# Patient Record
Sex: Male | Born: 1980 | Race: White | Hispanic: No | Smoking: Current every day smoker
Health system: Southern US, Community
[De-identification: ages and names within clinical notes are randomized; demographics above are authoritative.]

## PROBLEM LIST (undated history)

## (undated) DIAGNOSIS — K5792 Diverticulitis of intestine, part unspecified, without perforation or abscess without bleeding: Secondary | ICD-10-CM

## (undated) HISTORY — PX: IRRIGATION AND DEBRIDEMENT KNEE: SHX5185

## (undated) HISTORY — PX: MANDIBLE FRACTURE SURGERY: SHX706

---

## 2007-07-19 ENCOUNTER — Emergency Department (HOSPITAL_COMMUNITY): Admission: EM | Admit: 2007-07-19 | Discharge: 2007-07-19 | Payer: Self-pay | Admitting: Emergency Medicine

## 2007-09-17 ENCOUNTER — Emergency Department (HOSPITAL_COMMUNITY): Admission: EM | Admit: 2007-09-17 | Discharge: 2007-09-17 | Payer: Self-pay | Admitting: Emergency Medicine

## 2008-03-25 ENCOUNTER — Emergency Department (HOSPITAL_COMMUNITY): Admission: EM | Admit: 2008-03-25 | Discharge: 2008-03-25 | Payer: Self-pay | Admitting: Emergency Medicine

## 2008-12-17 ENCOUNTER — Encounter (INDEPENDENT_AMBULATORY_CARE_PROVIDER_SITE_OTHER): Payer: Self-pay | Admitting: Otolaryngology

## 2008-12-17 ENCOUNTER — Observation Stay (HOSPITAL_COMMUNITY): Admission: EM | Admit: 2008-12-17 | Discharge: 2008-12-18 | Payer: Self-pay | Admitting: Emergency Medicine

## 2009-01-25 ENCOUNTER — Ambulatory Visit (HOSPITAL_BASED_OUTPATIENT_CLINIC_OR_DEPARTMENT_OTHER): Admission: RE | Admit: 2009-01-25 | Discharge: 2009-01-25 | Payer: Self-pay | Admitting: Otolaryngology

## 2009-02-24 ENCOUNTER — Emergency Department (HOSPITAL_COMMUNITY): Admission: EM | Admit: 2009-02-24 | Discharge: 2009-02-24 | Payer: Self-pay | Admitting: Emergency Medicine
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
## 2010-10-22 LAB — POCT HEMOGLOBIN-HEMACUE: Hemoglobin: 15.8 g/dL (ref 13.0–17.0)

## 2010-10-23 LAB — POCT I-STAT, CHEM 8
BUN: 6 mg/dL (ref 6–23)
Calcium, Ion: 1.02 mmol/L — ABNORMAL LOW (ref 1.12–1.32)
Creatinine, Ser: 1.1 mg/dL (ref 0.4–1.5)
Glucose, Bld: 105 mg/dL — ABNORMAL HIGH (ref 70–99)
TCO2: 21 mmol/L (ref 0–100)

## 2010-10-23 LAB — RAPID URINE DRUG SCREEN, HOSP PERFORMED
Amphetamines: NOT DETECTED
Barbiturates: NOT DETECTED
Benzodiazepines: NOT DETECTED
Cocaine: NOT DETECTED

## 2010-10-23 LAB — URINALYSIS, ROUTINE W REFLEX MICROSCOPIC
Bilirubin Urine: NEGATIVE
Glucose, UA: NEGATIVE mg/dL
Ketones, ur: NEGATIVE mg/dL
Protein, ur: NEGATIVE mg/dL
pH: 6 (ref 5.0–8.0)

## 2010-10-23 LAB — URINE MICROSCOPIC-ADD ON

## 2010-10-23 LAB — PROTIME-INR: Prothrombin Time: 14.1 seconds (ref 11.6–15.2)

## 2010-10-23 LAB — CBC
MCHC: 34.3 g/dL (ref 30.0–36.0)
MCV: 94.6 fL (ref 78.0–100.0)
Platelets: 221 10*3/uL (ref 150–400)
RDW: 12.6 % (ref 11.5–15.5)

## 2010-10-23 LAB — LACTIC ACID, PLASMA: Lactic Acid, Venous: 2.7 mmol/L — ABNORMAL HIGH (ref 0.5–2.2)
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
## 2013-03-12 ENCOUNTER — Encounter (HOSPITAL_COMMUNITY): Payer: Self-pay | Admitting: *Deleted

## 2013-03-12 ENCOUNTER — Emergency Department (HOSPITAL_COMMUNITY): Payer: Self-pay

## 2013-03-12 ENCOUNTER — Emergency Department (HOSPITAL_COMMUNITY)
Admission: EM | Admit: 2013-03-12 | Discharge: 2013-03-12 | Disposition: A | Discharge status: Home / Self Care | Payer: Self-pay | Attending: Emergency Medicine | Admitting: Emergency Medicine

## 2013-03-12 DIAGNOSIS — R61 Generalized hyperhidrosis: Secondary | ICD-10-CM | POA: Insufficient documentation

## 2013-03-12 DIAGNOSIS — R630 Anorexia: Secondary | ICD-10-CM | POA: Insufficient documentation

## 2013-03-12 DIAGNOSIS — K5712 Diverticulitis of small intestine without perforation or abscess without bleeding: Secondary | ICD-10-CM | POA: Insufficient documentation

## 2013-03-12 DIAGNOSIS — R6883 Chills (without fever): Secondary | ICD-10-CM | POA: Insufficient documentation

## 2013-03-12 DIAGNOSIS — R1031 Right lower quadrant pain: Secondary | ICD-10-CM | POA: Insufficient documentation

## 2013-03-12 DIAGNOSIS — F17200 Nicotine dependence, unspecified, uncomplicated: Secondary | ICD-10-CM | POA: Insufficient documentation

## 2013-03-12 DIAGNOSIS — Z79899 Other long term (current) drug therapy: Secondary | ICD-10-CM | POA: Insufficient documentation

## 2013-03-12 LAB — COMPREHENSIVE METABOLIC PANEL
ALT: 16 U/L (ref 0–53)
BUN: 13 mg/dL (ref 6–23)
Calcium: 9.8 mg/dL (ref 8.4–10.5)
GFR calc Af Amer: >90 mL/min (ref >90)
Glucose, Bld: 106 mg/dL — ABNORMAL HIGH (ref 70–99)
Sodium: 138 mEq/L (ref 135–145)
Total Protein: 7.4 g/dL (ref 6.0–8.3)

## 2013-03-12 LAB — CBC WITH DIFFERENTIAL/PLATELET
Basophils Relative: 0 % (ref 0–1)
Eosinophils Absolute: 0.1 10*3/uL (ref 0.0–0.7)
Eosinophils Relative: 1 % (ref 0–5)
Lymphs Abs: 2.1 10*3/uL (ref 0.7–4.0)
MCH: 31.8 pg (ref 26.0–34.0)
MCHC: 35.8 g/dL (ref 30.0–36.0)
MCV: 88.7 fL (ref 78.0–100.0)
Platelets: 234 10*3/uL (ref 150–400)
RBC: 5.29 MIL/uL (ref 4.22–5.81)
RDW: 12.3 % (ref 11.5–15.5)

## 2013-03-12 LAB — URINALYSIS, ROUTINE W REFLEX MICROSCOPIC
Nitrite: NEGATIVE
Specific Gravity, Urine: 1.024 (ref 1.005–1.030)
Urobilinogen, UA: 0.2 mg/dL (ref 0.0–1.0)

## 2013-03-12 LAB — URINE MICROSCOPIC-ADD ON

## 2013-03-12 LAB — LIPASE, BLOOD: Lipase: 30 U/L (ref 11–59)

## 2013-03-12 MED ORDER — SODIUM CHLORIDE 0.9 % IV BOLUS (SEPSIS)
1000.0000 mL | Freq: Once | INTRAVENOUS | Status: AC
  Administered 2013-03-12: 1000 mL via INTRAVENOUS

## 2013-03-12 MED ORDER — ONDANSETRON HCL 4 MG/2ML IJ SOLN
4.0000 mg | Freq: Once | INTRAMUSCULAR | Status: AC
  Administered 2013-03-12: 4 mg via INTRAVENOUS
  Filled 2013-03-12: qty 2

## 2013-03-12 MED ORDER — METRONIDAZOLE 500 MG PO TABS: 500.0000 mg | ORAL_TABLET | Freq: Two times a day (BID) | ORAL | Status: DC

## 2013-03-12 MED ORDER — IOHEXOL 300 MG/ML  SOLN
100.0000 mL | Freq: Once | INTRAMUSCULAR | Status: AC | PRN
  Administered 2013-03-12: 100 mL via INTRAVENOUS

## 2013-03-12 MED ORDER — HYDROCODONE-ACETAMINOPHEN 5-325 MG PO TABS: 1.0000 | ORAL_TABLET | ORAL | Status: DC | PRN

## 2013-03-12 MED ORDER — METRONIDAZOLE 500 MG PO TABS
500.0000 mg | ORAL_TABLET | Freq: Once | ORAL | Status: AC
  Administered 2013-03-12: 500 mg via ORAL
  Filled 2013-03-12: qty 1

## 2013-03-12 MED ORDER — PROMETHAZINE HCL 25 MG PO TABS: 25.0000 mg | ORAL_TABLET | Freq: Four times a day (QID) | ORAL | Status: DC | PRN

## 2013-03-12 MED ORDER — IOHEXOL 300 MG/ML  SOLN
20.0000 mL | INTRAMUSCULAR | Status: AC
  Administered 2013-03-12: 25 mL via ORAL

## 2013-03-12 MED ORDER — CIPROFLOXACIN HCL 500 MG PO TABS: 500.0000 mg | ORAL_TABLET | Freq: Two times a day (BID) | ORAL | Status: DC

## 2013-03-12 MED ORDER — FENTANYL CITRATE 0.05 MG/ML IJ SOLN
50.0000 ug | INTRAMUSCULAR | Status: DC | PRN
  Administered 2013-03-12: 50 ug via INTRAVENOUS
  Filled 2013-03-12: qty 2

## 2013-03-12 MED ORDER — CIPROFLOXACIN HCL 500 MG PO TABS
500.0000 mg | ORAL_TABLET | Freq: Once | ORAL | Status: AC
  Administered 2013-03-12: 500 mg via ORAL
  Filled 2013-03-12: qty 1
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
## 2013-03-12 NOTE — ED Notes (Signed)
Reports generalized abd pain x 2 days, denies any n/v/d. No distress noted at triage.

## 2013-03-15 NOTE — ED Provider Notes (Signed)
Medical screening examination/treatment/procedure(s) were performed by non-physician practitioner and as supervising physician I was immediately available for consultation/collaboration.   Richardean Canal, MD 03/15/13 2131

## 2013-04-02 ENCOUNTER — Ambulatory Visit: Payer: Self-pay | Attending: Family Medicine | Admitting: Internal Medicine

## 2013-04-02 ENCOUNTER — Encounter: Payer: Self-pay | Admitting: Internal Medicine

## 2013-04-02 VITALS — BP 107/71 | HR 67 | Temp 98.9°F | Ht 66.0 in | Wt 175.0 lb

## 2013-04-02 DIAGNOSIS — G8929 Other chronic pain: Secondary | ICD-10-CM | POA: Insufficient documentation

## 2013-04-02 DIAGNOSIS — R1032 Left lower quadrant pain: Secondary | ICD-10-CM | POA: Insufficient documentation

## 2013-04-02 DIAGNOSIS — M545 Low back pain, unspecified: Secondary | ICD-10-CM | POA: Insufficient documentation

## 2013-04-02 DIAGNOSIS — F17200 Nicotine dependence, unspecified, uncomplicated: Secondary | ICD-10-CM | POA: Insufficient documentation

## 2013-04-02 DIAGNOSIS — K5732 Diverticulitis of large intestine without perforation or abscess without bleeding: Secondary | ICD-10-CM | POA: Insufficient documentation

## 2013-04-02 NOTE — Progress Notes (Signed)
Patient ID: Johnathan Poole, male   DOB: 02-Dec-1980, 32 y.o.   MRN: [id number] PCP:  Jeanann Lewandowsky, MD    Chief Complaint:  Post ED visit followup  HPI: 32 year old healthy male who was seen in the ED 2 weeks back for left lower quadrant abdominal pain for 2 days and had a CT scan of his abdomen and pelvis suggestive of diverticulitis of small bowel and was discharged on a course of oral ciprofloxacin and Flagyl here for followup. Patient reports that he does not have further abdominal pain but feels having  incomplete bowel movements. Denies any headache, blurred vision, chest pain, palpitations, shortness of breath, fever, chills, diarrhea, urinary symptoms. He does report some chronic back pain.  Allergies: No Known Allergies  Prior to Admission medications   Not on File    No past medical history on file.  No past surgical history on file.  Social History:  reports that he has been smoking Cigarettes.  He has been smoking about 0.00 packs per day. He does not have any smokeless tobacco history on file. He reports that he does not drink alcohol or use illicit drugs.  Family History  Problem Relation Age of Onset  . Cancer Father     Review of Systems:  As outlined in history of present illness  Physical Exam:  Filed Vitals:   04/02/13 1538  BP: 107/71  Pulse: 67  Temp: 98.9 F (37.2 C)  TempSrc: Oral  Height: 5\' 6"  (1.676 m)  Weight: 175 lb (79.379 kg)  SpO2: 98%    Constitutional: Vital signs reviewed.  Patient is a well-developed and well-nourished in no acute distress and cooperative with exam. HEENT: No pallor, moist oral mucosa Chest: Clear to auscultation bilaterally, no added sounds CVS: Normal S1 and S2, no murmurs or gallop Abdomen: Soft, nontender, nondistended, bowel sounds present Extremities: Warm, no edema CNS: AAO x3 Labs on Admission:  No results found for this or any previous visit (from the past 48 hour(s)).  Radiological Exams on  Admission: Ct Abdomen Pelvis W Contrast  03/12/2013   *RADIOLOGY REPORT*  Clinical Data: Right lower quadrant pain starting 2 days ago.  No history of nausea fever or vomiting  CT ABDOMEN AND PELVIS WITH CONTRAST  Technique:  Multidetector CT imaging of the abdomen and pelvis was performed following the standard protocol during bolus administration of intravenous contrast.  Contrast: OMNIPAQUE IOHEXOL 300 MG/ML  SOLN  Comparison: 12/17/2008  Findings: In the right mid abdomen, there is a diverticulum protruding from a small bowel, measuring 13 mm in size.  It is associated with stranding in the mesenteric fat, but no extraluminal air or focal fluid collection.  The bowel is otherwise unremarkable.  A normal appendix is visualized.  Minor subsegmental atelectasis is noted at the posterior lung bases.  Heart is normal in size.  There is a small hiatal hernia.  Normal liver, spleen, gallbladder and pancreas.  No bile duct dilation.  No adrenal masses.  Small left renal cyst, stable.  The kidneys are otherwise unremarkable.  Normal ureters and bladder.  No adenopathy.  There is no ascites.  Minimal degenerative change noted at L3-L4.  No other bony abnormality.  IMPRESSION: Small bowel diverticulitis.  There is a single inflamed small bowel diverticulum in the right mid abdomen.  There is no extraluminal air or fluid collection to suggest an abscess or perforation.  No other acute findings.  A normal appendix is visualized.   Original Report Authenticated By:  Amie Portland, M.D.    Assessment/Plan Small bowel diverticulitis Patient has completed course of antibiotic. I have instructed him on increasing fluid intake and take diet high in fibers. Also counseled on smoking cessation.  Low back pain Secondary to improper posture at work. Instructed on maintaining proper posture to prevent back injury at work  Follow up as needed  Gonzella Lex, Zayana Salvador 04/02/2013, 4:21 PM

## 2016-10-30 ENCOUNTER — Emergency Department (HOSPITAL_COMMUNITY)
Admission: EM | Admit: 2016-10-30 | Discharge: 2016-10-30 | Disposition: A | Discharge status: Home / Self Care | Payer: Self-pay | Attending: Emergency Medicine | Admitting: Emergency Medicine

## 2016-10-30 ENCOUNTER — Encounter (HOSPITAL_COMMUNITY): Payer: Self-pay | Admitting: Emergency Medicine

## 2016-10-30 DIAGNOSIS — K0889 Other specified disorders of teeth and supporting structures: Secondary | ICD-10-CM | POA: Insufficient documentation

## 2016-10-30 DIAGNOSIS — F17210 Nicotine dependence, cigarettes, uncomplicated: Secondary | ICD-10-CM | POA: Insufficient documentation

## 2016-10-30 MED ORDER — IBUPROFEN 800 MG PO TABS: 800.0000 mg | ORAL_TABLET | Freq: Three times a day (TID) | ORAL | 0 refills | Status: DC | PRN

## 2016-10-30 MED ORDER — PENICILLIN V POTASSIUM 500 MG PO TABS: 500.0000 mg | ORAL_TABLET | Freq: Four times a day (QID) | ORAL | 0 refills | Status: AC

## 2016-10-30 NOTE — Discharge Instructions (Signed)
Followup with a dentist is very important for ongoing evaluation and management of recurrent dental pain. Return to emergency department for emergent changing or worsening symptoms."  Low-cost dental clinic: **[facility]  at [phone]**   You may also call [phone]  Dental Assistance If the dentist on-call cannot see you, please use the resources below:   Patients with Medicaid: [facility] [address], [phone], [phone]  If unable to pay, or uninsured, contact [facility] ([phone]) or [facility] ([phone] in [place], [phone] in [place]) to become qualified for the adult dental clinic  Other Low-Cost Community [facility] Services: [facility]- [address], [place], [place], [address]    [phone], Ext. [phone]    2nd and 4th Thursday of the month at 6:30am    10 clients each day by appointment, can sometimes see walk-in     patients if someone does not show for an appointment [facility]- [address], [place], [place], [address]    [phone] [facility]- [address], [place], [place], [address]    [phone]  [facility]- [phone] [facility]- [phone] [facility]- [phone]  

## 2016-10-30 NOTE — ED Provider Notes (Signed)
Emergency Department Provider Note  By signing my name below, I, Cynda Acres, attest that this documentation has been prepared under the direction and in the presence of Maia Plan, MD. Electronically Signed: Cynda Acres, Scribe. 10/30/16. 1:04 PM.  I have reviewed the triage vital signs and the nursing notes.   HISTORY  Chief Complaint Dental Pain   HPI Comments: Johnathan Poole is a 36 y.o. male with no pertinent medical history, who presents to the Emergency Department complaining of sudden-onset, constant left upper dental pain that began one week ago. Patient reports having an extraction in the past, in which the patient states the dental provider left a piece behind. Patient denies being advised that any part of the tooth was left behind. Patient reports associated gum swelling. No modifying factors indicated. Patient does not have a current dental provider. Patient denies any fever, chills, or facial swelling.   History reviewed. No pertinent past medical history.  Patient Active Problem List   Diagnosis Date Noted  . Abdominal pain, left lower quadrant 04/02/2013    History reviewed. No pertinent surgical history.    Allergies Patient has no known allergies.  Family History  Problem Relation Age of Onset  . Cancer Father     Social History Social History  Substance Use Topics  . Smoking status: Current Every Day Smoker    Types: Cigarettes  . Smokeless tobacco: Not on file  . Alcohol use Yes    Review of Systems Constitutional: No fever/chills Eyes: No visual changes. ENT: No sore throat. + for left upper dental pain.  Cardiovascular: Denies chest pain. Respiratory: Denies shortness of breath. Gastrointestinal: No abdominal pain.  No nausea, no vomiting.  No diarrhea.  No constipation. Genitourinary: Negative for dysuria. Musculoskeletal: Negative for back pain. Skin: Negative for rash. Neurological: Negative for headaches, focal weakness or  numbness.  10-point ROS otherwise negative.  ____________________________________________   PHYSICAL EXAM:  VITAL SIGNS: ED Triage Vitals [10/30/16 1246]  Enc Vitals Group     BP (!) 135/95     Pulse Rate (!) 104     Resp 18     Temp 98.6 F (37 C)     Temp Source Oral     SpO2 96 %     Pain Score 7   Constitutional: Alert and oriented. Well appearing and in no acute distress. Eyes: Conjunctivae are normal. Head: Atraumatic. Nose: No congestion/rhinnorhea. Mouth/Throat: Mucous membranes are moist.  Oropharynx non-erythematous. No dental abscess, tenderness to palpation, or trismus. Speaking in normal tone of voice.  Neck: No stridor.   Musculoskeletal: No lower extremity tenderness nor edema. No gross deformities of extremities. Neurologic:  Normal speech and language. No gross focal neurologic deficits are appreciated.  Skin:  Skin is warm, dry and intact. No rash noted.  ____________________________________________   PROCEDURES  Procedure(s) performed:   Procedures  None ____________________________________________   INITIAL IMPRESSION / ASSESSMENT AND PLAN / ED COURSE  Pertinent labs & imaging results that were available during my care of the patient were reviewed by me and considered in my medical decision making (see chart for details).  Patient resents to the emergency department for evaluation of dental pain. No fevers, trismus, suspicion for deep space neck infection. He has pain at the extraction site from a wisdom tooth 4 years ago. No abscess or active drainage at this time. We'll start on penicillin and referred to outpatient dentistry. Provided Motrin for pain.  At this time, I do not feel  there is any life-threatening condition present. I have reviewed and discussed all results (EKG, imaging, lab, urine as appropriate), exam findings with patient. I have reviewed nursing notes and appropriate previous records.  I feel the patient is safe to be  discharged home without further emergent workup. Discussed usual and customary return precautions. Patient and family (if present) verbalize understanding and are comfortable with this plan.  Patient will follow-up with their primary care provider. If they do not have a primary care provider, information for follow-up has been provided to them. All questions have been answered.    ____________________________________________  FINAL CLINICAL IMPRESSION(S) / ED DIAGNOSES  Final diagnoses:  Pain, dental     MEDICATIONS GIVEN DURING THIS VISIT:  None  NEW OUTPATIENT MEDICATIONS STARTED DURING THIS VISIT:  New Prescriptions   IBUPROFEN (ADVIL,MOTRIN) 800 MG TABLET    Take 1 tablet (800 mg total) by mouth every 8 (eight) hours as needed.   PENICILLIN V POTASSIUM (VEETID) 500 MG TABLET    Take 1 tablet (500 mg total) by mouth 4 (four) times daily.   I personally performed the services described in this documentation, which was scribed in my presence. The recorded information has been reviewed and is accurate.   Alona Bene, MD Emergency Medicine    Maia Plan, MD 10/30/16 1311

## 2016-10-30 NOTE — ED Triage Notes (Signed)
Pt sts left upper dental pain x 1 week

## 2021-05-22 ENCOUNTER — Emergency Department (HOSPITAL_COMMUNITY): Payer: Self-pay

## 2021-05-22 ENCOUNTER — Emergency Department (HOSPITAL_COMMUNITY)
Admission: EM | Admit: 2021-05-22 | Discharge: 2021-05-22 | Disposition: A | Discharge status: Home / Self Care | Payer: Self-pay | Attending: Emergency Medicine | Admitting: Emergency Medicine

## 2021-05-22 ENCOUNTER — Other Ambulatory Visit: Payer: Self-pay

## 2021-05-22 ENCOUNTER — Encounter (HOSPITAL_COMMUNITY): Payer: Self-pay | Admitting: Emergency Medicine

## 2021-05-22 ENCOUNTER — Telehealth (HOSPITAL_COMMUNITY): Payer: Self-pay | Admitting: Radiology

## 2021-05-22 DIAGNOSIS — R0602 Shortness of breath: Secondary | ICD-10-CM | POA: Insufficient documentation

## 2021-05-22 DIAGNOSIS — R079 Chest pain, unspecified: Secondary | ICD-10-CM | POA: Insufficient documentation

## 2021-05-22 DIAGNOSIS — F17210 Nicotine dependence, cigarettes, uncomplicated: Secondary | ICD-10-CM | POA: Insufficient documentation

## 2021-05-22 LAB — BASIC METABOLIC PANEL
Anion gap: 7 (ref 5–15)
BUN: 9 mg/dL (ref 6–20)
CO2: 23 mmol/L (ref 22–32)
Calcium: 9.4 mg/dL (ref 8.9–10.3)
Chloride: 106 mmol/L (ref 98–111)
Creatinine, Ser: 0.97 mg/dL (ref 0.61–1.24)
GFR, Estimated: >60 mL/min (ref >60)
Glucose, Bld: 107 mg/dL — ABNORMAL HIGH (ref 70–99)
Potassium: 3.9 mmol/L (ref 3.5–5.1)
Sodium: 136 mmol/L (ref 135–145)

## 2021-05-22 LAB — BRAIN NATRIURETIC PEPTIDE: B Natriuretic Peptide: 16.6 pg/mL (ref 0.0–100.0)

## 2021-05-22 LAB — TROPONIN I (HIGH SENSITIVITY)
Troponin I (High Sensitivity): 3 ng/L (ref <18)
Troponin I (High Sensitivity): 3 ng/L (ref <18)

## 2021-05-22 LAB — D-DIMER, QUANTITATIVE: D-Dimer, Quant: 0.64 ug/mL-FEU — ABNORMAL HIGH (ref 0.00–0.50)

## 2021-05-22 MED ORDER — ACETAMINOPHEN 500 MG PO TABS
1000.0000 mg | ORAL_TABLET | Freq: Once | ORAL | Status: AC
Start: 2021-05-22 — End: 2021-05-22
  Administered 2021-05-22: 1000 mg via ORAL
  Filled 2021-05-22: qty 2

## 2021-05-22 MED ORDER — IOHEXOL 350 MG/ML SOLN
75.0000 mL | Freq: Once | INTRAVENOUS | Status: AC | PRN
  Administered 2021-05-22: 75 mL via INTRAVENOUS

## 2021-05-22 NOTE — ED Triage Notes (Signed)
Pt. Stated, ive had central chest pain for a month and its hard to breath just normal . Both legs were swollen at one time
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
## 2021-05-22 NOTE — Discharge Instructions (Addendum)
You are seen today in the emergency department for evaluation of your chest pain.  Your work-up was very reassuring for no acute abnormalities.  Given this, along with your low risk factors, I feel that you are stable for discharge at this time.  I have given you a referral to a cardiologist that I would like for you to call in the next day or so for further evaluation of your symptoms.  In the interim, please return emergently if you have crushing chest pain, chest pain that radiates down your arm or up your jaw with significant sweating, or any new or worsening symptoms.  Additionally, smoking cessation is extremely important for your health as it puts you at significantly increased risk of having a cardiac event in the future as well as plenty other health problems.

## 2021-05-22 NOTE — Telephone Encounter (Signed)
Received a call from patient's brother Barbara Cower) trying to find out if Johnathan Poole had been discharged and needed transportation or if he was still in house. Patient has been discharged from the ED. Brother aware and will go to patient's home to check on him. JM

## 2021-05-22 NOTE — ED Provider Notes (Signed)
Emergency Medicine Provider Triage Evaluation Note  Johnathan Poole , a 40 y.o. male  was evaluated in triage.  Pt complains of chest pain that has been present for a month.  He reports it never goes away but certain things seem to make it worse.  He reports now it feels like he cannot take a deep breath without pressing on the center of his chest.  He also reports that he has been feeling fatigued.  He reports chest pain is not specifically worsened by exertion.  He reports when chest pain first started he had swelling in his lower extremities but that seems to have gotten better.  He has no known medical problems but has not seen a doctor in 20 years.  Review of Systems  Positive: Chest pain, shortness of breath, fatigue Negative: Fever, cough, abdominal pain  Physical Exam  BP (!) 136/103 (BP Location: Right Arm)   Pulse 80   Temp 98.8 F (37.1 C) (Oral)   Resp 16   SpO2 99%  Gen:   Awake, no distress   Resp:  Normal effort  MSK:   Moves extremities without difficulty  Other:    Medical Decision Making  Medically screening exam initiated at 10:34 AM.  Appropriate orders placed.  GEFFREY MICHAELSEN was informed that the remainder of the evaluation will be completed by another provider, this initial triage assessment does not replace that evaluation, and the importance of remaining in the ED until their evaluation is complete.     Dartha Lodge, PA-C 05/22/21 1044    Mancel Bale, MD 05/22/21 [phone]

## 2021-05-22 NOTE — ED Notes (Signed)
Reviewed discharge instructions with patient. Follow-up care reviewed. Patient verbalized understanding. Patient A&Ox4, VSS, and ambulatory with steady gait upon discharge.  

## 2022-04-03 ENCOUNTER — Ambulatory Visit (INDEPENDENT_AMBULATORY_CARE_PROVIDER_SITE_OTHER): Payer: Medicaid Other

## 2022-04-03 ENCOUNTER — Ambulatory Visit (INDEPENDENT_AMBULATORY_CARE_PROVIDER_SITE_OTHER): Payer: Medicaid Other | Admitting: Orthopaedic Surgery

## 2022-04-03 DIAGNOSIS — M545 Low back pain, unspecified: Secondary | ICD-10-CM

## 2022-04-03 MED ORDER — METHOCARBAMOL 750 MG PO TABS: 750.0000 mg | ORAL_TABLET | Freq: Two times a day (BID) | ORAL | 2 refills | Status: DC | PRN

## 2022-04-03 MED ORDER — PREDNISONE 10 MG (21) PO TBPK: ORAL_TABLET | ORAL | 0 refills | Status: DC

## 2022-04-03 NOTE — Progress Notes (Signed)
Office Visit Note   Patient: Johnathan Poole           Date of Birth: 12-07-1980           MRN: [id number] Visit Date: 04/03/2022              Requested by: No referring provider defined for this encounter. PCP: Patient, No Pcp Per   Assessment & Plan: Visit Diagnoses:  1. Low back pain, unspecified back pain laterality, unspecified chronicity, unspecified whether sciatica present     Plan: Impression is acute exacerbation of underlying low back pain.  At this point, I have discussed starting the patient on a steroid taper and muscle relaxer for which she is agreeable to.  Have also offered him an IM Toradol injection for which she would like to proceed.  If his symptoms do not improve over the next 2 weeks or worsen in the meantime he will let us know.  Call with concerns or questions in the meantime.  Follow-Up Instructions: Return if symptoms worsen or fail to improve.   Orders:  Orders Placed This Encounter  Procedures   XR Lumbar Spine 2-3 Views   Meds ordered this encounter  Medications   predniSONE (STERAPRED UNI-PAK 21 TAB) 10 MG (21) TBPK tablet    Sig: Take as directed    Dispense:  21 tablet    Refill:  0   methocarbamol (ROBAXIN-750) 750 MG tablet    Sig: Take 1 tablet (750 mg total) by mouth 2 (two) times daily as needed for muscle spasms.    Dispense:  20 tablet    Refill:  2      Procedures: No procedures performed   Clinical Data: No additional findings.   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  This began several years ago but significantly worsened when he was trying to get out of bed this morning.  He denies any new injury or change in activity.  The pain he has is to the middle of the lower back without any radiation down either leg.  Symptoms are worse going from a seated to standing position.  He has  taken Tylenol without relief.  He denies any paresthesias down either lower extremity.  No bowel or bladder change or saddle paresthesias.  Review of Systems as detailed in HPI.  All others reviewed and are negative.   Objective: Vital Signs: There were no vitals taken for this visit.  Physical Exam well-developed well-nourished gentleman in no acute distress.  Alert and oriented x3.  Ortho Exam lumbar spine exam shows no spinous or paraspinous tenderness.  No pain with flexion or extension.  He does have pain with right and left-sided rotation.  Positive straight leg raise both sides.  He is neurovascular intact distally.  Specialty Comments:  No specialty comments available.  Imaging: XR Lumbar Spine 2-3 Views  Result Date: 04/03/2022 Mild degenerative changes L4-5 and L5-S1    PMFS History: Patient Active Problem List   Diagnosis Date Noted   Abdominal pain, left lower quadrant 04/02/2013   No past medical history on file.  Family History  Problem Relation Age of Onset   Cancer Father     No past surgical history on file. Social History   Occupational History   Not on file  Tobacco Use   Smoking status: Every  Day    Types: Cigarettes   Smokeless tobacco: Never  Substance and Sexual Activity   Alcohol use: Yes   Drug use: Yes    Types: Marijuana   Sexual activity: Not on file

## 2022-06-14 ENCOUNTER — Other Ambulatory Visit: Payer: Self-pay

## 2022-06-14 ENCOUNTER — Emergency Department (HOSPITAL_COMMUNITY): Payer: Medicaid Other

## 2022-06-14 ENCOUNTER — Emergency Department (HOSPITAL_COMMUNITY)
Admission: EM | Admit: 2022-06-14 | Discharge: 2022-06-14 | Disposition: A | Discharge status: Home / Self Care | Payer: Medicaid Other | Attending: Emergency Medicine | Admitting: Emergency Medicine

## 2022-06-14 ENCOUNTER — Encounter (HOSPITAL_COMMUNITY): Payer: Self-pay

## 2022-06-14 DIAGNOSIS — Y990 Civilian activity done for income or pay: Secondary | ICD-10-CM | POA: Insufficient documentation

## 2022-06-14 DIAGNOSIS — Y9389 Activity, other specified: Secondary | ICD-10-CM | POA: Insufficient documentation

## 2022-06-14 DIAGNOSIS — Z23 Encounter for immunization: Secondary | ICD-10-CM | POA: Diagnosis not present

## 2022-06-14 DIAGNOSIS — S61512A Laceration without foreign body of left wrist, initial encounter: Secondary | ICD-10-CM | POA: Insufficient documentation

## 2022-06-14 DIAGNOSIS — W312XXA Contact with powered woodworking and forming machines, initial encounter: Secondary | ICD-10-CM | POA: Insufficient documentation

## 2022-06-14 MED ORDER — TETANUS-DIPHTH-ACELL PERTUSSIS 5-2.5-18.5 LF-MCG/0.5 IM SUSY
0.5000 mL | PREFILLED_SYRINGE | Freq: Once | INTRAMUSCULAR | Status: AC
  Administered 2022-06-14: 0.5 mL via INTRAMUSCULAR
  Filled 2022-06-14: qty 0.5

## 2022-06-14 MED ORDER — LIDOCAINE-EPINEPHRINE (PF) 2 %-1:200000 IJ SOLN
10.0000 mL | Freq: Once | INTRAMUSCULAR | Status: AC
  Administered 2022-06-14: 10 mL
  Filled 2022-06-14: qty 20

## 2022-06-14 MED ORDER — CEPHALEXIN 500 MG PO CAPS: 500.0000 mg | ORAL_CAPSULE | Freq: Three times a day (TID) | ORAL | 0 refills | Status: AC

## 2022-06-14 MED ORDER — CEPHALEXIN 250 MG PO CAPS
500.0000 mg | ORAL_CAPSULE | Freq: Once | ORAL | Status: AC
  Administered 2022-06-14: 500 mg via ORAL
  Filled 2022-06-14: qty 2

## 2022-06-14 NOTE — Discharge Instructions (Addendum)
Call Dr. Merlyn Lot tomorrow AM Please take all of your antibiotics until finished!   You may develop abdominal discomfort or diarrhea from the antibiotic.  You may help offset this with probiotics which you can buy or get in yogurt. Do not eat  or take the probiotics until 2 hours after your antibiotic.

## 2022-06-14 NOTE — ED Notes (Signed)
Pt verbalized understanding of d/c instructions, meds, and followup care. Denies questions. VSS, no distress noted. Steady gait to exit with all belongings.  ?

## 2022-06-14 NOTE — ED Triage Notes (Signed)
Pt arrives to ED by POV with c/o left wrist pain. Pt was using a grinder cutting rock. Deep laceration noted to posterior aspect of wrist. Bleeding controlled. Pt unable to fully extend his thumb.

## 2022-06-14 NOTE — ED Provider Notes (Signed)
[facility] EMERGENCY DEPARTMENT Provider Note   CSN: [id number] Arrival date & time: 06/14/22  1911     History Chief Complaint  Patient presents with   Wrist Pain    Johnathan Poole is a 41 y.o. male.   Wrist Pain  Patient reports using hands on several hours ago and having laceration occurred to the left wrist. Reported that he was working with a saw when the saw slipped and cut into his wrist. Last tetanus was 7 years ago. Reports pain in left wrist with abduction and abduction of thumb. Some weakness and restricted range of motion of left thumb. Denies loss of strength, sensation, or numbness of left wrist/hand.     Home Medications Prior to Admission medications   Medication Sig Start Date End Date Taking? Authorizing Provider  ibuprofen (ADVIL,MOTRIN) 800 MG tablet Take 1 tablet (800 mg total) by mouth every 8 (eight) hours as needed. 10/30/16   Long, Arlyss Repress, MD  methocarbamol (ROBAXIN-750) 750 MG tablet Take 1 tablet (750 mg total) by mouth 2 (two) times daily as needed for muscle spasms. 04/03/22   Cristie Hem, PA-C  predniSONE (STERAPRED UNI-PAK 21 TAB) 10 MG (21) TBPK tablet Take as directed 04/03/22   Cristie Hem, PA-C      Allergies    Patient has no known allergies.    Review of Systems   Review of Systems  Skin:  Positive for wound.  Neurological:  Positive for weakness and numbness.  All other systems reviewed and are negative.   Physical Exam Updated Vital Signs BP (!) 134/93   Pulse (!) 113   Temp 98.2 F (36.8 C)   Resp 18   SpO2 96%  Physical Exam Vitals and nursing note reviewed.  Constitutional:      Appearance: Normal appearance. He is normal weight.  HENT:     Head: Normocephalic and atraumatic.  Eyes:     Conjunctiva/sclera: Conjunctivae normal.     Pupils: Pupils are equal, round, and reactive to light.  Musculoskeletal:        General: Signs of injury present.  Skin:    General: Skin is warm.      Capillary Refill: Capillary refill takes less than 2 seconds.     Findings: Lesion present.     Comments: 3 cm laceration on dorsal aspect of left wrist.  Neurological:     Mental Status: He is alert.          ED Results / Procedures / Treatments   Labs (all labs ordered are listed, but only abnormal results are displayed) Labs Reviewed - No data to display  EKG None  Radiology No results found.  Procedures Procedures   Medications Ordered in ED Medications  lidocaine-EPINEPHrine (XYLOCAINE W/EPI) 2 %-1:200000 (PF) injection 10 mL (has no administration in time range)    ED Course/ Medical Decision Making/ A&P                           Medical Decision Making Amount and/or Complexity of Data Reviewed Radiology: ordered.  Risk Prescription drug management.   8:15 PM Care of @PATIENTNAME @ transferred to [facility] and [facility] at the end of my shift as the patient will require reassessment once labs/imaging have resulted. Patient presentation, ED course, and plan of care discussed with review of all pertinent labs and imaging. Please see his/her note for further details regarding further ED course and  disposition. Plan at time of handoff is consult with hand surgery. With hand surgery approval, will perform laceration repair using suture. This may be altered or completely changed at the discretion of the oncoming team pending results of further workup.    Final Clinical Impression(s) / ED Diagnoses Final diagnoses:  None    Rx / DC Orders ED Discharge Orders     None         Smitty Knudsen, PA-C 06/14/22 2020    Terrilee Files, MD 06/15/22 1126

## 2022-06-14 NOTE — ED Provider Notes (Signed)
Accepted handoff at shift change from Arial Z PA-C. Please see prior provider note for more detail.   Briefly: Patient is 41 y.o.     Plan: Talk to hand surgery      Physical Exam  BP (!) 134/93   Pulse (!) 113   Temp 98.2 F (36.8 C)   Resp 18   SpO2 96%   Physical Exam       Procedures  .Marland KitchenLaceration Repair  Date/Time: 06/15/2022 3:59 PM  Performed by: Gailen Shelter, PA Authorized by: Gailen Shelter, PA   Consent:    Consent obtained:  Verbal   Consent given by:  Parent and patient   Risks discussed:  Infection, pain, poor cosmetic result and poor wound healing Universal protocol:    Procedure explained and questions answered to patient or proxy's satisfaction: yes     Relevant documents present and verified: yes     Test results available: yes     Imaging studies available: yes     Required blood products, implants, devices, and special equipment available: yes     Site/side marked: yes     Immediately prior to procedure, a time out was called: yes     Patient identity confirmed:  Verbally with patient and arm band Laceration details:    Location: dorsum of wrist.   Length (cm):  4 Exploration:    Hemostasis achieved with:  Direct pressure   Imaging obtained: x-ray     Imaging outcome: foreign body not noted     Wound extent: tendon damage (not visually confirmed but w difficulty extending at thumb)     Wound extent: no foreign body     Contaminated: no   Treatment:    Amount of cleaning:  Standard   Irrigation solution:  Sterile saline   Visualized foreign bodies/material removed: no   Skin repair:    Repair method:  Sutures   Suture size:  4-0   Suture material:  Prolene   Suture technique:  Simple interrupted   Number of sutures:  3 Approximation:    Approximation:  Close Repair type:    Repair type:  Complex Post-procedure details:    Dressing:  Antibiotic ointment   Procedure completion:  Tolerated Comments:     Patient placed in  thumb spica for immobilization  Results for orders placed or performed during the hospital encounter of 05/22/21  Basic metabolic panel  Result Value Ref Range   Sodium 136 135 - 145 mmol/L   Potassium 3.9 3.5 - 5.1 mmol/L   Chloride 106 98 - 111 mmol/L   CO2 23 22 - 32 mmol/L   Glucose, Bld 107 (H) 70 - 99 mg/dL   BUN 9 6 - 20 mg/dL   Creatinine, Ser 9.93 0.61 - 1.24 mg/dL   Calcium 9.4 8.9 - 71.6 mg/dL   GFR, Estimated >96 >78 mL/min   Anion gap 7 5 - 15  Brain natriuretic peptide  Result Value Ref Range   B Natriuretic Peptide 16.6 0.0 - 100.0 pg/mL  D-dimer, quantitative  Result Value Ref Range   D-Dimer, Quant 0.64 (H) 0.00 - 0.50 ug/mL-FEU  Troponin I (High Sensitivity)  Result Value Ref Range   Troponin I (High Sensitivity) 3 <18 ng/L  Troponin I (High Sensitivity)  Result Value Ref Range   Troponin I (High Sensitivity) 3 <18 ng/L   DG Wrist Complete Left  Result Date: 06/14/2022 CLINICAL DATA:  Dorsal wrist laceration. EXAM: LEFT WRIST - COMPLETE 3+ VIEW  COMPARISON:  None Available. FINDINGS: There is no evidence of fracture or dislocation. There is no evidence of arthropathy or other focal bone abnormality. Soft tissues are unremarkable. No radiopaque foreign body identified. IMPRESSION: Negative. Electronically Signed   By: Obie Dredge M.D.   On: 06/14/2022 20:24   s ED Course / MDM    Medical Decision Making Amount and/or Complexity of Data Reviewed Radiology: ordered.  Risk Prescription drug management.    3 stitches placed for loose closure  Concern for extensor pollicis longus laceration given inability to extend thumb \\Personally  viewed x-ray of wrist which was without any abnormal findings.  Radiology read  Discussed with Dr. Merlyn Lot who agrees with plan to discharge with antibiotics and will see patient in the office.  Patient discharged home w [facility] office information.   In thumb spica.  Keflex for wound prophylaxis    Gailen Shelter,  [place] 06/15/22 1601    Terrilee Files, MD 06/16/22 1157

## 2022-06-18 ENCOUNTER — Other Ambulatory Visit: Payer: Self-pay | Admitting: Orthopedic Surgery

## 2022-06-19 ENCOUNTER — Other Ambulatory Visit: Payer: Self-pay

## 2022-06-19 ENCOUNTER — Encounter (HOSPITAL_BASED_OUTPATIENT_CLINIC_OR_DEPARTMENT_OTHER): Payer: Self-pay | Admitting: Orthopedic Surgery

## 2022-06-21 ENCOUNTER — Ambulatory Visit (HOSPITAL_BASED_OUTPATIENT_CLINIC_OR_DEPARTMENT_OTHER): Payer: Medicaid Other | Admitting: Certified Registered Nurse Anesthetist

## 2022-06-21 ENCOUNTER — Ambulatory Visit (HOSPITAL_BASED_OUTPATIENT_CLINIC_OR_DEPARTMENT_OTHER)
Admission: RE | Admit: 2022-06-21 | Discharge: 2022-06-21 | Disposition: A | Discharge status: Home / Self Care | Payer: Medicaid Other | Source: Ambulatory Visit | Attending: Orthopedic Surgery | Admitting: Orthopedic Surgery

## 2022-06-21 ENCOUNTER — Other Ambulatory Visit: Payer: Self-pay

## 2022-06-21 ENCOUNTER — Encounter (HOSPITAL_BASED_OUTPATIENT_CLINIC_OR_DEPARTMENT_OTHER)
Admission: RE | Disposition: A | Discharge status: Home / Self Care | Payer: Self-pay | Source: Ambulatory Visit | Attending: Orthopedic Surgery

## 2022-06-21 ENCOUNTER — Encounter (HOSPITAL_BASED_OUTPATIENT_CLINIC_OR_DEPARTMENT_OTHER): Payer: Self-pay | Admitting: Orthopedic Surgery

## 2022-06-21 DIAGNOSIS — W3189XA Contact with other specified machinery, initial encounter: Secondary | ICD-10-CM | POA: Diagnosis not present

## 2022-06-21 DIAGNOSIS — S61512A Laceration without foreign body of left wrist, initial encounter: Secondary | ICD-10-CM | POA: Insufficient documentation

## 2022-06-21 DIAGNOSIS — F17210 Nicotine dependence, cigarettes, uncomplicated: Secondary | ICD-10-CM | POA: Diagnosis not present

## 2022-06-21 DIAGNOSIS — N289 Disorder of kidney and ureter, unspecified: Secondary | ICD-10-CM

## 2022-06-21 DIAGNOSIS — S66222A Laceration of extensor muscle, fascia and tendon of left thumb at wrist and hand level, initial encounter: Secondary | ICD-10-CM

## 2022-06-21 HISTORY — DX: Diverticulitis of intestine, part unspecified, without perforation or abscess without bleeding: K57.92

## 2022-06-21 HISTORY — PX: TENDON EXPLORATION: SHX5112

## 2022-06-21 HISTORY — PX: REPAIR EXTENSOR TENDON: SHX5382

## 2022-06-21 SURGERY — REPAIR, TENDON, EXTENSOR
Anesthesia: Regional | Site: Wrist | Laterality: Left

## 2022-06-21 MED ORDER — ONDANSETRON HCL 4 MG/2ML IJ SOLN
INTRAMUSCULAR | Status: DC | PRN
  Administered 2022-06-21: 4 mg via INTRAVENOUS

## 2022-06-21 MED ORDER — OXYCODONE-ACETAMINOPHEN 5-325 MG PO TABS: ORAL_TABLET | ORAL | 0 refills | Status: DC

## 2022-06-21 MED ORDER — DEXMEDETOMIDINE HCL IN NACL 400 MCG/100ML IV SOLN
INTRAVENOUS | Status: DC | PRN
  Administered 2022-06-21: 12 ug via INTRAVENOUS

## 2022-06-21 MED ORDER — PROPOFOL 500 MG/50ML IV EMUL
INTRAVENOUS | Status: DC | PRN
  Administered 2022-06-21: 75 ug/kg/min via INTRAVENOUS

## 2022-06-21 MED ORDER — LACTATED RINGERS IV SOLN: INTRAVENOUS | Status: DC

## 2022-06-21 MED ORDER — EPHEDRINE 5 MG/ML INJ
INTRAVENOUS | Status: AC
  Filled 2022-06-21: qty 5

## 2022-06-21 MED ORDER — FENTANYL CITRATE (PF) 100 MCG/2ML IJ SOLN
INTRAMUSCULAR | Status: DC | PRN
  Administered 2022-06-21: 50 ug via INTRAVENOUS

## 2022-06-21 MED ORDER — FENTANYL CITRATE (PF) 100 MCG/2ML IJ SOLN
INTRAMUSCULAR | Status: AC
  Filled 2022-06-21: qty 2

## 2022-06-21 MED ORDER — PROPOFOL 10 MG/ML IV BOLUS
INTRAVENOUS | Status: DC | PRN
  Administered 2022-06-21: 20 mg via INTRAVENOUS

## 2022-06-21 MED ORDER — MIDAZOLAM HCL 2 MG/2ML IJ SOLN
2.0000 mg | Freq: Once | INTRAMUSCULAR | Status: AC
  Administered 2022-06-21: 2 mg via INTRAVENOUS

## 2022-06-21 MED ORDER — 0.9 % SODIUM CHLORIDE (POUR BTL) OPTIME
TOPICAL | Status: DC | PRN
  Administered 2022-06-21: 120 mL

## 2022-06-21 MED ORDER — FENTANYL CITRATE (PF) 100 MCG/2ML IJ SOLN
100.0000 ug | Freq: Once | INTRAMUSCULAR | Status: AC
  Administered 2022-06-21: 100 ug via INTRAVENOUS

## 2022-06-21 MED ORDER — ROPIVACAINE HCL 5 MG/ML IJ SOLN
INTRAMUSCULAR | Status: DC | PRN
  Administered 2022-06-21: 26 mL via PERINEURAL

## 2022-06-21 MED ORDER — CEFAZOLIN SODIUM-DEXTROSE 2-4 GM/100ML-% IV SOLN
INTRAVENOUS | Status: AC
  Filled 2022-06-21: qty 100

## 2022-06-21 MED ORDER — CEFAZOLIN SODIUM-DEXTROSE 2-4 GM/100ML-% IV SOLN
2.0000 g | INTRAVENOUS | Status: AC
  Administered 2022-06-21: 2 g via INTRAVENOUS

## 2022-06-21 MED ORDER — MIDAZOLAM HCL 2 MG/2ML IJ SOLN
INTRAMUSCULAR | Status: AC
  Filled 2022-06-21: qty 2

## 2022-06-21 SURGICAL SUPPLY — 82 items
APL PRP STRL LF DISP 70% ISPRP (MISCELLANEOUS) ×1
APL SKNCLS STERI-STRIP NONHPOA (GAUZE/BANDAGES/DRESSINGS)
BAG DECANTER FOR FLEXI CONT (MISCELLANEOUS) IMPLANT
BALL CTTN LRG ABS STRL LF (GAUZE/BANDAGES/DRESSINGS)
BENZOIN TINCTURE PRP APPL 2/3 (GAUZE/BANDAGES/DRESSINGS) IMPLANT
BLADE MINI RND TIP GREEN BEAV (BLADE) IMPLANT
BLADE SURG 15 STRL LF DISP TIS (BLADE) ×2 IMPLANT
BLADE SURG 15 STRL SS (BLADE) ×2
BNDG CMPR 75X21 PLY HI ABS (MISCELLANEOUS)
BNDG CMPR 9X4 STRL LF SNTH (GAUZE/BANDAGES/DRESSINGS) ×1
BNDG ELASTIC 2X5.8 VLCR STR LF (GAUZE/BANDAGES/DRESSINGS) IMPLANT
BNDG ELASTIC 3X5.8 VLCR STR LF (GAUZE/BANDAGES/DRESSINGS) ×1 IMPLANT
BNDG ESMARK 4X9 LF (GAUZE/BANDAGES/DRESSINGS) ×1 IMPLANT
BNDG GAUZE DERMACEA FLUFF 4 (GAUZE/BANDAGES/DRESSINGS) IMPLANT
BNDG GZE DERMACEA 4 6PLY (GAUZE/BANDAGES/DRESSINGS)
CHLORAPREP W/TINT 26 (MISCELLANEOUS) ×1 IMPLANT
CORD BIPOLAR FORCEPS 12FT (ELECTRODE) ×1 IMPLANT
COTTONBALL LRG STERILE PKG (GAUZE/BANDAGES/DRESSINGS) IMPLANT
COVER BACK TABLE 60X90IN (DRAPES) ×1 IMPLANT
COVER MAYO STAND STRL (DRAPES) ×1 IMPLANT
CUFF TOURN SGL QUICK 18X4 (TOURNIQUET CUFF) ×1 IMPLANT
DRAPE EXTREMITY T 121X128X90 (DISPOSABLE) ×1 IMPLANT
DRAPE OEC MINIVIEW 54X84 (DRAPES) IMPLANT
DRAPE SURG 17X23 STRL (DRAPES) ×1 IMPLANT
GAUZE 4X4 16PLY ~~LOC~~+RFID DBL (SPONGE) IMPLANT
GAUZE PAD ABD 8X10 STRL (GAUZE/BANDAGES/DRESSINGS) IMPLANT
GAUZE SPONGE 4X4 12PLY STRL (GAUZE/BANDAGES/DRESSINGS) ×1 IMPLANT
GAUZE STRETCH 2X75IN STRL (MISCELLANEOUS) IMPLANT
GAUZE XEROFORM 1X8 LF (GAUZE/BANDAGES/DRESSINGS) ×1 IMPLANT
GLOVE BIO SURGEON STRL SZ7.5 (GLOVE) ×1 IMPLANT
GLOVE BIOGEL PI IND STRL 8 (GLOVE) ×1 IMPLANT
GOWN STRL REUS W/ TWL LRG LVL3 (GOWN DISPOSABLE) ×1 IMPLANT
GOWN STRL REUS W/TWL LRG LVL3 (GOWN DISPOSABLE) ×1
GOWN STRL REUS W/TWL XL LVL3 (GOWN DISPOSABLE) ×1 IMPLANT
K-WIRE DBL .035X4 NSTRL (WIRE)
KWIRE DBL .035X4 NSTRL (WIRE) IMPLANT
LOOP VESSEL MAXI BLUE (MISCELLANEOUS) IMPLANT
NDL HYPO 25X1 1.5 SAFETY (NEEDLE) IMPLANT
NDL KEITH (NEEDLE) IMPLANT
NEEDLE HYPO 22GX1.5 SAFETY (NEEDLE) IMPLANT
NEEDLE HYPO 25X1 1.5 SAFETY (NEEDLE) IMPLANT
NEEDLE KEITH (NEEDLE) IMPLANT
NS IRRIG 1000ML POUR BTL (IV SOLUTION) ×1 IMPLANT
PACK BASIN DAY SURGERY FS (CUSTOM PROCEDURE TRAY) ×1 IMPLANT
PAD CAST 3X4 CTTN HI CHSV (CAST SUPPLIES) ×1 IMPLANT
PAD CAST 4YDX4 CTTN HI CHSV (CAST SUPPLIES) IMPLANT
PADDING CAST ABS COTTON 3X4 (CAST SUPPLIES) IMPLANT
PADDING CAST ABS COTTON 4X4 ST (CAST SUPPLIES) ×1 IMPLANT
PADDING CAST COTTON 3X4 STRL (CAST SUPPLIES) ×1
PADDING CAST COTTON 4X4 STRL (CAST SUPPLIES)
SLEEVE SCD COMPRESS KNEE MED (STOCKING) IMPLANT
SLING ARM FOAM STRAP LRG (SOFTGOODS) IMPLANT
SPIKE FLUID TRANSFER (MISCELLANEOUS) IMPLANT
SPLINT PLASTER CAST XFAST 3X15 (CAST SUPPLIES) IMPLANT
SPLINT PLASTER CAST XFAST 4X15 (CAST SUPPLIES) IMPLANT
STOCKINETTE 4X48 STRL (DRAPES) ×1 IMPLANT
STRIP CLOSURE SKIN 1/2X4 (GAUZE/BANDAGES/DRESSINGS) IMPLANT
SUT CHROMIC 5 0 P 3 (SUTURE) IMPLANT
SUT ETHIBOND 3-0 V-5 (SUTURE) IMPLANT
SUT ETHILON 3 0 PS 1 (SUTURE) IMPLANT
SUT ETHILON 4 0 PS 2 18 (SUTURE) IMPLANT
SUT FIBERWIRE 3-0 18 TAPR NDL (SUTURE) ×1
SUT FIBERWIRE 4-0 18 DIAM BLUE (SUTURE)
SUT MERSILENE 2.0 SH NDLE (SUTURE) IMPLANT
SUT MERSILENE 4 0 P 3 (SUTURE) IMPLANT
SUT MNCRL AB 4-0 PS2 18 (SUTURE) IMPLANT
SUT PROLENE 2 0 SH DA (SUTURE) IMPLANT
SUT PROLENE 6 0 P 1 18 (SUTURE) IMPLANT
SUT SILK 2 0 PERMA HAND 18 BK (SUTURE) IMPLANT
SUT SILK 4 0 PS 2 (SUTURE) IMPLANT
SUT VIC AB 3-0 PS1 18 (SUTURE)
SUT VIC AB 3-0 PS1 18XBRD (SUTURE) IMPLANT
SUT VIC AB 4-0 P-3 18XBRD (SUTURE) IMPLANT
SUT VIC AB 4-0 P3 18 (SUTURE)
SUT VICRYL 4-0 PS2 18IN ABS (SUTURE) IMPLANT
SUTURE FIBERWR 3-0 18 TAPR NDL (SUTURE) IMPLANT
SUTURE FIBERWR 4-0 18 DIA BLUE (SUTURE) IMPLANT
SYR BULB EAR ULCER 3OZ GRN STR (SYRINGE) ×1 IMPLANT
SYR CONTROL 10ML LL (SYRINGE) IMPLANT
TOWEL GREEN STERILE FF (TOWEL DISPOSABLE) ×2 IMPLANT
TUBE NG 5FR 35IN ENFIT (TUBING) IMPLANT
UNDERPAD 30X36 HEAVY ABSORB (UNDERPADS AND DIAPERS) ×1 IMPLANT

## 2022-06-21 NOTE — Transfer of Care (Signed)
Immediate Anesthesia Transfer of Care Note  Patient: Johnathan Poole  Procedure(s) Performed: REPAIR EXTENSOR TENDONS LEFT WRIST (Left: Wrist) TENDON EXPLORATION (Left: Wrist)  Patient Location: PACU  Anesthesia Type:MAC and MAC combined with regional for post-op pain  Level of Consciousness: awake, alert , and oriented  Airway & Oxygen Therapy: Patient Spontanous Breathing and Patient connected to face mask oxygen  Post-op Assessment: Report given to RN and Post -op Vital signs reviewed and stable  Post vital signs: Reviewed and stable  Last Vitals:  Vitals Value Taken Time  BP    Temp    Pulse 83 06/21/22 1442  Resp    SpO2 96 % 06/21/22 1442  Vitals shown include unvalidated device data.  Last Pain:  Vitals:   06/21/22 1219  TempSrc: Oral  PainSc: 3       Patients Stated Pain Goal: 3 (91/91/66 0600)  Complications: No notable events documented.

## 2022-06-21 NOTE — Anesthesia Procedure Notes (Signed)
Anesthesia Regional Block: Supraclavicular block   Pre-Anesthetic Checklist: , timeout performed,  Correct Patient, Correct Site, Correct Laterality,  Correct Procedure, Correct Position, site marked,  Risks and benefits discussed,  Surgical consent,  Pre-op evaluation,  At surgeon's request and post-op pain management  Laterality: Upper and Left  Prep: chloraprep       Needles:  Injection technique: Single-shot  Needle Type: Echogenic Needle     Needle Length: 5cm  Needle Gauge: 21     Additional Needles:   Procedures:,,,, ultrasound used (permanent image in chart),,    Narrative:  Start time: 06/21/2022 12:44 PM End time: 06/21/2022 12:54 PM Injection made incrementally with aspirations every 5 mL.  Performed by: Personally  Anesthesiologist: Trevor Iha, MD
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
## 2022-06-21 NOTE — Op Note (Signed)
I assisted Surgeon(s) and Role:    Betha Loa, MD - Primary on the Procedure(s): REPAIR EXTENSOR TENDONS LEFT WRIST TENDON EXPLORATION on 06/21/2022.  I provided assistance on this case as follows: Set up, approach, location of the laceration repair followed by transposition.repair, closure of the wound and application of the dressing and splints.    Electronically signed by: Cindee Salt, MD Date: 06/21/2022 Time: 2:41 PM

## 2022-06-21 NOTE — Op Note (Signed)
NAME: Johnathan Poole MEDICAL RECORD NO: [id number] DATE OF BIRTH: 1981/07/06 FACILITY: [facility] LOCATION: [facility] PHYSICIAN: Tami Ribas, MD   OPERATIVE REPORT   DATE OF PROCEDURE: 06/21/22    PREOPERATIVE DIAGNOSIS: Left EPL tendon laceration   POSTOPERATIVE DIAGNOSIS: Left EPL tendon laceration   PROCEDURE: Repair left EPL tendon laceration with transposition   SURGEON:  Betha Loa, M.D.   ASSISTANT: Cindee Salt, MD   ANESTHESIA:  Regional with sedation   INTRAVENOUS FLUIDS:  Per anesthesia flow sheet.   ESTIMATED BLOOD LOSS:  Minimal.   COMPLICATIONS:  None.   SPECIMENS:  none   TOURNIQUET TIME:    Total Tourniquet Time Documented: Upper Arm (Left) - 35 minutes Total: Upper Arm (Left) - 35 minutes    DISPOSITION:  Stable to PACU.   INDICATIONS: 41 year old male states 6 days ago he sustained a laceration to his left wrist from an angle grinder.  He was seen at the emergency department where the wound was cleaned and sutured.  He was splinted and followed up in the office.  He wishes to proceed with operative repair of extensor tendon.  Risks, benefits and alternatives of surgery were discussed including the risks of blood loss, infection, damage to nerves, vessels, tendons, ligaments, bone for surgery, need for additional surgery, complications with wound healing, continued pain, stiffness.  He voiced understanding of these risks and elected to proceed.  OPERATIVE COURSE:  After being identified preoperatively by myself,  the patient and I agreed on the procedure and site of the procedure.  The surgical site was marked.  Surgical consent had been signed. Preoperative IV antibiotic prophylaxis was given. He was transferred to the operating room and placed on the operating table in supine position with the left upper extremity on an arm board.  Sedation was induced by the anesthesiologist. A regional block had been performed by anesthesia in  preoperative holding.    Left upper extremity was prepped and draped in normal sterile orthopedic fashion.  A surgical pause was performed between the surgeons, anesthesia, and operating room staff and all were in agreement as to the patient, procedure, and site of procedure.  Tourniquet at the proximal aspect of the extremity was inflated to 250 mmHg after exsanguination of the arm with an Esmarch bandage.  The wound was explored.  There was laceration of the EPL tendon.  The ECRL and ECRB tendons in the fourth dorsal compartment tendons were intact.  The APL and EPB tendons were outside the zone of injury.  The distal stump of the tendon was easily found.  The proximal stump was unable to be retrieved with hemostat.  An additional incision was made in the wrist.  This was carried in subcutaneous tissues by spreading technique.  The third dorsal compartment was opened.  The EPL tendon stump was identified.  A 3-0 FiberWire suture was passed through the tendon in a modified Kessler and grasping technique.  A Carroll tendon passer was used to retrieve the tendon and bring it out to the distal wound.  The 3-0 FiberWire suture was then passed again in a modified Kessler technique through the distal stump of tendon and tied.  This was adequate to reapproximate the tenderness.  It was oversewn with a figure-of-eight suture for augmentation of strength.  The thumb was in full extension.  The wounds were copiously irrigated with sterile saline and closed with 4-0 nylon in a horizontal mattress fashion.  They were dressed with sterile Xeroform  and 4 x 4's and wrapped with a Kerlix bandage.  A thumb spica splint was placed and wrapped with Kerlix and Ace bandage.  The tourniquet was deflated at 35 minutes.  Fingertips were pink with brisk capillary refill after deflation of tourniquet.  The operative  drapes were broken down.  The patient was awoken from anesthesia safely.  He was transferred back to the stretcher and taken  to PACU in stable condition.  I will see him back in the office in 1 week for postoperative followup.  I will give him a prescription for Percocet 5/325 1-2 tabs PO q6 hours prn pain, dispense # 15.   Betha Loa, MD Electronically signed, 06/21/22

## 2022-06-21 NOTE — Anesthesia Preprocedure Evaluation (Addendum)
Anesthesia Evaluation  Patient identified by MRN, date of birth, ID band Patient awake    Reviewed: Allergy & Precautions, NPO status , Patient's Chart, lab work & pertinent test results  Airway Mallampati: II  TM Distance: >3 FB Neck ROM: Full    Dental no notable dental hx. (+) Teeth Intact, Dental Advisory Given   Pulmonary Current Smoker and Patient abstained from smoking.   Pulmonary exam normal breath sounds clear to auscultation       Cardiovascular Exercise Tolerance: Good negative cardio ROS Normal cardiovascular exam Rhythm:Regular Rate:Normal     Neuro/Psych negative neurological ROS  negative psych ROS   GI/Hepatic negative GI ROS, Neg liver ROS,,,  Endo/Other  negative endocrine ROS    Renal/GU Renal disease     Musculoskeletal negative musculoskeletal ROS (+)    Abdominal   Peds  Hematology   Anesthesia Other Findings   Reproductive/Obstetrics                             Anesthesia Physical Anesthesia Plan  ASA: 2  Anesthesia Plan: Regional   Post-op Pain Management: Regional block* and Minimal or no pain anticipated   Induction:   PONV Risk Score and Plan: 1 and Treatment may vary due to age or medical condition, Midazolam and Ondansetron  Airway Management Planned: Nasal Cannula and Natural Airway  Additional Equipment: None  Intra-op Plan:   Post-operative Plan:   Informed Consent: I have reviewed the patients History and Physical, chart, labs and discussed the procedure including the risks, benefits and alternatives for the proposed anesthesia with the patient or authorized representative who has indicated his/her understanding and acceptance.     Dental advisory given  Plan Discussed with:   Anesthesia Plan Comments:        Anesthesia Quick Evaluation

## 2022-06-21 NOTE — Anesthesia Postprocedure Evaluation (Signed)
Anesthesia Post Note  Patient: Johnathan Poole  Procedure(s) Performed: REPAIR EXTENSOR TENDONS LEFT WRIST (Left: Wrist) TENDON EXPLORATION (Left: Wrist)     Patient location during evaluation: PACU Anesthesia Type: Regional Level of consciousness: awake and alert Pain management: pain level controlled Vital Signs Assessment: post-procedure vital signs reviewed and stable Respiratory status: spontaneous breathing, nonlabored ventilation, respiratory function stable and patient connected to nasal cannula oxygen Cardiovascular status: stable and blood pressure returned to baseline Postop Assessment: no apparent nausea or vomiting Anesthetic complications: no   No notable events documented.  Last Vitals:  Vitals:   06/21/22 1502 06/21/22 1514  BP: (!) 129/99 (!) 142/85  Pulse: 78 82  Resp: 18 20  Temp:  (!) 36.2 C  SpO2:  95%    Last Pain:  Vitals:   06/21/22 1514  TempSrc: Oral  PainSc: 0-No pain                 Shelton Silvas

## 2022-06-21 NOTE — Discharge Instructions (Addendum)
Post Anesthesia Home Care Instructions  Activity: Get plenty of rest for the remainder of the day. A responsible individual must stay with you for 24 hours following the procedure.  For the next 24 hours, DO NOT: -Drive a car -Operate machinery -Drink alcoholic beverages -Take any medication unless instructed by your physician -Make any legal decisions or sign important papers.  Meals: Start with liquid foods such as gelatin or soup. Progress to regular foods as tolerated. Avoid greasy, spicy, heavy foods. If nausea and/or vomiting occur, drink only clear liquids until the nausea and/or vomiting subsides. Call your physician if vomiting continues.  Special Instructions/Symptoms: Your throat may feel dry or sore from the anesthesia or the breathing tube placed in your throat during surgery. If this causes discomfort, gargle with warm salt water. The discomfort should disappear within 24 hours.  If you had a scopolamine patch placed behind your ear for the management of post- operative nausea and/or vomiting:  1. The medication in the patch is effective for 72 hours, after which it should be removed.  Wrap patch in a tissue and discard in the trash. Wash hands thoroughly with soap and water. 2. You may remove the patch earlier than 72 hours if you experience unpleasant side effects which may include dry mouth, dizziness or visual disturbances. 3. Avoid touching the patch. Wash your hands with soap and water after contact with the patch.      Regional Anesthesia Blocks  1. Numbness or the inability to move the "blocked" extremity may last from 3-48 hours after placement. The length of time depends on the medication injected and your individual response to the medication. If the numbness is not going away after 48 hours, call your surgeon.  2. The extremity that is blocked will need to be protected until the numbness is gone and the  Strength has returned. Because you cannot feel it, you  will need to take extra care to avoid injury. Because it may be weak, you may have difficulty moving it or using it. You may not know what position it is in without looking at it while the block is in effect.  3. For blocks in the legs and feet, returning to weight bearing and walking needs to be done carefully. You will need to wait until the numbness is entirely gone and the strength has returned. You should be able to move your leg and foot normally before you try and bear weight or walk. You will need someone to be with you when you first try to ensure you do not fall and possibly risk injury.  4. Bruising and tenderness at the needle site are common side effects and will resolve in a few days.  5. Persistent numbness or new problems with movement should be communicated to the surgeon or the [facility] ([phone])/ [facility] ([phone]).    [facility] Center Instructions Hand Surgery  Wound Care: Keep your hand elevated above the level of your heart.  Do not allow it to dangle by your side.  Keep the dressing dry and do not remove it unless your doctor advises you to do so.  He will usually change it at the time of your post-op visit.  Moving your fingers is advised to stimulate circulation but will depend on the site of your surgery.  If you have a splint applied, your doctor will advise you regarding movement.  Activity: Do not drive or operate machinery today.  Rest today and   then you may return to your normal activity and work as indicated by your physician.  Diet:  Drink liquids today or eat a light diet.  You may resume a regular diet tomorrow.    General expectations: Pain for two to three days. Fingers may become slightly swollen.  Call your doctor if any of the following occur: Severe pain not relieved by pain medication. Elevated temperature. Dressing soaked with blood. Inability to move fingers. White or bluish color to fingers.  

## 2022-06-21 NOTE — Progress Notes (Signed)
Assisted Dr. Houser with left, supraclavicular, ultrasound guided block. Side rails up, monitors on throughout procedure. See vital signs in flow sheet. Tolerated Procedure well. 

## 2022-06-22 ENCOUNTER — Encounter (HOSPITAL_BASED_OUTPATIENT_CLINIC_OR_DEPARTMENT_OTHER): Payer: Self-pay | Admitting: Orthopedic Surgery

## 2023-04-13 ENCOUNTER — Ambulatory Visit (HOSPITAL_COMMUNITY): Payer: Self-pay

## 2023-04-13 ENCOUNTER — Emergency Department (HOSPITAL_COMMUNITY)
Admission: EM | Admit: 2023-04-13 | Discharge: 2023-04-13 | Disposition: A | Discharge status: Home / Self Care | Payer: Medicaid Other | Attending: Emergency Medicine | Admitting: Emergency Medicine

## 2023-04-13 ENCOUNTER — Other Ambulatory Visit: Payer: Self-pay

## 2023-04-13 ENCOUNTER — Encounter (HOSPITAL_COMMUNITY): Payer: Self-pay

## 2023-04-13 DIAGNOSIS — L02419 Cutaneous abscess of limb, unspecified: Secondary | ICD-10-CM

## 2023-04-13 DIAGNOSIS — L02412 Cutaneous abscess of left axilla: Secondary | ICD-10-CM | POA: Diagnosis not present

## 2023-04-13 DIAGNOSIS — R2232 Localized swelling, mass and lump, left upper limb: Secondary | ICD-10-CM | POA: Diagnosis present

## 2023-04-13 MED ORDER — LIDOCAINE-EPINEPHRINE (PF) 2 %-1:200000 IJ SOLN
10.0000 mL | Freq: Once | INTRAMUSCULAR | Status: AC
  Administered 2023-04-13: 10 mL
  Filled 2023-04-13: qty 20

## 2023-04-13 MED ORDER — DOXYCYCLINE HYCLATE 100 MG PO CAPS: 100.0000 mg | ORAL_CAPSULE | Freq: Two times a day (BID) | ORAL | 0 refills | Status: AC

## 2023-04-13 NOTE — ED Provider Notes (Signed)
[place] EMERGENCY DEPARTMENT AT [facility] Provider Note   CSN: [id number] Arrival date & time: 04/13/23  4098     History  Chief Complaint  Patient presents with   Lump under Lt arm    Johnathan Poole is a 42 y.o. male.  HPI  Patient is a 42 year old male with no pertinent past medical history present emergency room today with complaints of left arm swelling for 3 days.  He states that the area of swelling is underneath his arm in his axilla.  He states that it is extremely painful to touch and move around.  Denies any fevers lightness or dizziness.  He states that it was more swollen yesterday he states he has been doing persistent warm compresses and feels that it is slightly improved but states that it is still extremely painful.       Home Medications Prior to Admission medications   Medication Sig Start Date End Date Taking? Authorizing Provider  ibuprofen (ADVIL,MOTRIN) 800 MG tablet Take 1 tablet (800 mg total) by mouth every 8 (eight) hours as needed. 10/30/16   Long, Arlyss Repress, MD  oxyCODONE-acetaminophen (PERCOCET/ROXICET) 5-325 MG tablet 1-2 tabs PO q6 hours prn pain 06/21/22   Betha Loa, MD      Allergies    Patient has no known allergies.    Review of Systems   Review of Systems  Physical Exam Updated Vital Signs BP (!) 136/98   Pulse 92   Temp 98.1 F (36.7 C) (Oral)   Resp 17   Ht 5\' 7"  (1.702 m)   Wt 74.8 kg   SpO2 100%   BMI 25.84 kg/m  Physical Exam Vitals and nursing note reviewed.  Constitutional:      General: He is not in acute distress.    Appearance: Normal appearance. He is not ill-appearing.  HENT:     Head: Normocephalic and atraumatic.     Mouth/Throat:     Mouth: Mucous membranes are moist.  Eyes:     General: No scleral icterus.       Right eye: No discharge.        Left eye: No discharge.     Conjunctiva/sclera: Conjunctivae normal.  Pulmonary:     Effort: Pulmonary effort is normal.     Breath sounds: No  stridor.  Abdominal:     General: Abdomen is flat.  Skin:    Comments: Axilla with 3 x 3 cm area of swelling with approximately 1 cm of surrounding erythema and indurated tissue.  This is located in the left axilla at the upper aspect. There is an area of fluctuance that is approximately 3 cm x 2 cm at the center of the edematous area.  Neurological:     Mental Status: He is alert and oriented to person, place, and time. Mental status is at baseline.     ED Results / Procedures / Treatments   Labs (all labs ordered are listed, but only abnormal results are displayed) Labs Reviewed - No data to display  EKG None  Radiology No results found.  Procedures .Marland KitchenIncision and Drainage  Date/Time: 04/13/2023 2:06 PM  Performed by: Gailen Shelter, PA Authorized by: Gailen Shelter, PA   Consent:    Consent obtained:  Verbal   Consent given by:  Patient   Risks discussed:  Bleeding, incomplete drainage, pain and damage to other organs   Alternatives discussed:  No treatment Universal protocol:    Procedure explained and questions answered  to patient or proxy's satisfaction: yes     Relevant documents present and verified: yes     Test results available : yes     Imaging studies available: yes     Required blood products, implants, devices, and special equipment available: yes     Site/side marked: yes     Immediately prior to procedure, a time out was called: yes     Patient identity confirmed:  Verbally with patient Location:    Type:  Abscess   Size:  2 x 3 cm   Location:  Upper extremity   Upper extremity location: axilla L. Pre-procedure details:    Skin preparation:  Betadine Anesthesia:    Anesthesia method:  Local infiltration   Local anesthetic:  Lidocaine 2% WITH epi Procedure type:    Complexity:  Complex Procedure details:    Incision types:  Cruciate   Incision depth:  Subcutaneous   Wound management:  Probed and deloculated, irrigated with saline and  extensive cleaning   Drainage:  Purulent   Drainage amount:  Copious   Packing materials:  1/4 in iodoform gauze Post-procedure details:    Procedure completion:  Tolerated well, no immediate complications     Medications Ordered in ED Medications  lidocaine-EPINEPHrine (XYLOCAINE W/EPI) 2 %-1:200000 (PF) injection 10 mL (has no administration in time range)    ED Course/ Medical Decision Making/ A&P                                 Medical Decision Making Risk Prescription drug management.   Patient is a 42 year old male with no pertinent past medical history present emergency room today with complaints of left arm swelling for 3 days.  He states that the area of swelling is underneath his arm in his axilla.  He states that it is extremely painful to touch and move around.  Denies any fevers lightness or dizziness.  He states that it was more swollen yesterday he states he has been doing persistent warm compresses and feels that it is slightly improved but states that it is still extremely painful.  Bedside ultrasound utilized for soft tissue exam which confirms cutaneous abscess.  Shared decision-making conversation with patient and he is agreeable to incision and drainage.  Incision and drainage was successful.  Copious purulent material was produced.  Small amount of iodoform packing was used to pack the cavity.  Patient will follow-up in 72 hours with primary care or return to ER for reevaluation and packing change.    Final Clinical Impression(s) / ED Diagnoses Final diagnoses:  Axillary abscess    Rx / DC Orders ED Discharge Orders     None         Gailen Shelter, [place] 04/13/23 1407    Jacalyn Lefevre, MD 04/14/23 1546

## 2023-04-13 NOTE — ED Triage Notes (Signed)
Pt came in via POV d/t a few swollen bumps under his Lt arm he noticed a few days ago. States they have been burning, they have grown from a smaller size to the size "of an egg." Also endorses his Lt let was swollen a few days ago but has went back down to normal size now. A/Ox4, rates pain 9/10 during triage.

## 2023-04-13 NOTE — Discharge Instructions (Addendum)
Please follow-up with primary care provider or return to emergency room in 72 hours for wound check.  Please read the attached instructions, warm compresses at least twice a day, expect some drainage to continue after discharge from the emergency room.  Please take all of your antibiotics until finished!   You may develop abdominal discomfort or diarrhea from the antibiotic.  You may help offset this with probiotics which you can buy or get in yogurt. Do not eat  or take the probiotics until 2 hours after your antibiotic.     Please use Tylenol or ibuprofen for pain.  You may use 600 mg ibuprofen every 6 hours or 1000 mg of Tylenol every 6 hours.  You may choose to alternate between the 2.  This would be most effective.  Not to exceed 4 g of Tylenol within 24 hours.  Not to exceed 3200 mg ibuprofen 24 hours.

## 2023-04-17 ENCOUNTER — Encounter: Payer: Self-pay | Admitting: Nurse Practitioner

## 2023-04-17 ENCOUNTER — Ambulatory Visit: Payer: Medicaid Other | Attending: Nurse Practitioner | Admitting: Nurse Practitioner

## 2023-04-17 VITALS — BP 129/89 | HR 102 | Ht <= 58 in | Wt 168.4 lb

## 2023-04-17 DIAGNOSIS — Z09 Encounter for follow-up examination after completed treatment for conditions other than malignant neoplasm: Secondary | ICD-10-CM | POA: Diagnosis not present

## 2023-04-17 DIAGNOSIS — I83812 Varicose veins of left lower extremities with pain: Secondary | ICD-10-CM

## 2023-04-17 DIAGNOSIS — L02419 Cutaneous abscess of limb, unspecified: Secondary | ICD-10-CM | POA: Diagnosis not present

## 2023-04-17 LAB — POCT ABI - SCREENING FOR PILOT NO CHARGE
Left ABI: 1.39
Right ABI: 1.38
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
## 2023-04-17 NOTE — Progress Notes (Signed)
Veins on left lower legs that swells

## 2023-06-07 ENCOUNTER — Ambulatory Visit: Payer: Medicaid Other | Attending: Internal Medicine | Admitting: Internal Medicine

## 2023-06-07 ENCOUNTER — Encounter: Payer: Self-pay | Admitting: Internal Medicine

## 2023-06-07 ENCOUNTER — Emergency Department (HOSPITAL_COMMUNITY)
Admission: EM | Admit: 2023-06-07 | Discharge: 2023-06-07 | Disposition: A | Discharge status: Home / Self Care | Payer: Medicaid Other | Attending: Emergency Medicine | Admitting: Emergency Medicine

## 2023-06-07 ENCOUNTER — Encounter (HOSPITAL_COMMUNITY): Payer: Self-pay

## 2023-06-07 VITALS — BP 125/88 | HR 97 | Temp 97.9°F | Ht 67.0 in | Wt 160.0 lb

## 2023-06-07 DIAGNOSIS — K6289 Other specified diseases of anus and rectum: Secondary | ICD-10-CM

## 2023-06-07 DIAGNOSIS — K623 Rectal prolapse: Secondary | ICD-10-CM | POA: Diagnosis not present

## 2023-06-07 MED ORDER — DOCUSATE SODIUM 100 MG PO CAPS: 100.0000 mg | ORAL_CAPSULE | Freq: Two times a day (BID) | ORAL | 0 refills | Status: DC

## 2023-06-07 MED ORDER — DOCUSATE SODIUM 100 MG PO CAPS: 100.0000 mg | ORAL_CAPSULE | Freq: Two times a day (BID) | ORAL | 0 refills | Status: AC

## 2023-06-07 NOTE — ED Provider Triage Note (Signed)
Emergency Medicine Provider Triage Evaluation Note  Johnathan Poole , a 42 y.o. male  was evaluated in triage.  Pt complains of rectal prolapse.  Was seen earlier today PCP.  Concern for rectal prolapse was sent here for surgical consult.  Denies any associated pain.  States that he does have burning itching around his rectum.  Denies nausea vomiting diarrhea.  Denies fever.  Review of Systems  Positive: See above Negative: See above  Physical Exam  There were no vitals taken for this visit. Gen:   Awake, no distress   Resp:  Normal effort  MSK:   Moves extremities without difficulty  Other:    Medical Decision Making  Medically screening exam initiated at 3:42 PM.  Appropriate orders placed.  Johnathan Poole was informed that the remainder of the evaluation will be completed by another provider, this initial triage assessment does not replace that evaluation, and the importance of remaining in the ED until their evaluation is complete.  Work up started   Gareth Eagle, [place] 06/07/23 1543

## 2023-06-07 NOTE — ED Triage Notes (Signed)
Pt is being sent by pcp for a rectal prolapse that she wanted to have him sent here for due to her not being able to get him a surgeon consult. He is sitting comfortably in triage, and has no other complaints. States there eis a burning itching in his rectum.

## 2023-06-07 NOTE — Discharge Instructions (Signed)
Follow-up with general surgery in the next couple weeks
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
## 2023-06-07 NOTE — Patient Instructions (Signed)
We have not been able to get you in with the surgeon today.  I advise being seen in the emergency room for the rectal prolapse.

## 2023-06-07 NOTE — ED Provider Notes (Signed)
  [place] EMERGENCY DEPARTMENT AT [facility] Provider Note   CSN: [id number] Arrival date & time: 06/07/23  1452     History {Add pertinent medical, surgical, social history, OB history to HPI:1} Chief Complaint  Patient presents with   Rectal Prolapse    Johnathan Poole is a 42 y.o. male.   Rectal Bleeding      Home Medications Prior to Admission medications   Not on File      Allergies    Patient has no known allergies.    Review of Systems   Review of Systems  Gastrointestinal:  Positive for hematochezia.    Physical Exam Updated Vital Signs BP (!) 136/99   Pulse 100   Temp 98.1 F (36.7 C)   Resp 18   SpO2 98%  Physical Exam  ED Results / Procedures / Treatments   Labs (all labs ordered are listed, but only abnormal results are displayed) Labs Reviewed - No data to display  EKG None  Radiology No results found.  Procedures Procedures  {Document cardiac monitor, telemetry assessment procedure when appropriate:1}  Medications Ordered in ED Medications - No data to display  ED Course/ Medical Decision Making/ A&P   {   Click here for ABCD2, HEART and other calculatorsREFRESH Note before signing :1}                              Medical Decision Making  Patient with a rectal prolapse that was reduced.  He will follow-up with surgery  {Document critical care time when appropriate:1} {Document review of labs and clinical decision tools ie heart score, Chads2Vasc2 etc:1}  {Document your independent review of radiology images, and any outside records:1} {Document your discussion with family members, caretakers, and with consultants:1} {Document social determinants of health affecting pt's care:1} {Document your decision making why or why not admission, treatments were needed:1} Final Clinical Impression(s) / ED Diagnoses Final diagnoses:  Rectal prolapse    Rx / DC Orders ED Discharge Orders     None

## 2024-01-27 ENCOUNTER — Encounter (HOSPITAL_COMMUNITY): Payer: Self-pay | Admitting: *Deleted

## 2024-01-27 ENCOUNTER — Other Ambulatory Visit: Payer: Self-pay

## 2024-01-27 ENCOUNTER — Emergency Department (HOSPITAL_COMMUNITY)
Admission: EM | Admit: 2024-01-27 | Discharge: 2024-01-27 | Disposition: A | Discharge status: Home / Self Care | Attending: Emergency Medicine | Admitting: Emergency Medicine

## 2024-01-27 DIAGNOSIS — K120 Recurrent oral aphthae: Secondary | ICD-10-CM | POA: Diagnosis present

## 2024-01-27 MED ORDER — TRIAMCINOLONE ACETONIDE 0.1 % MT PSTE: 1.0000 | PASTE | Freq: Two times a day (BID) | OROMUCOSAL | 12 refills | Status: AC

## 2024-01-27 NOTE — Discharge Instructions (Signed)
 Apply ointment to mouth as discussed. Follow up with your dentist. Recheck with your primary care provider.

## 2024-01-27 NOTE — ED Notes (Signed)
 Pt verbalized understanding of discharge instructions.pt ambulated from ed

## 2024-01-27 NOTE — ED Provider Notes (Signed)
 [place] EMERGENCY DEPARTMENT AT [facility] Provider Note   CSN: [id number] Arrival date & time: 01/27/24  9762     Patient presents with: Mouth Lesions   Johnathan Poole is a 43 y.o. male.   43 year old male presents with complaint of ulcers in his mouth.  States that he bit his mouth but now has additional lesions popping up.  Patient is a smoker.  Also states that his legs get red at times although this is not present at this time.       Prior to Admission medications   Medication Sig Start Date End Date Taking? Authorizing Provider  triamcinolone  (KENALOG ) 0.1 % paste Use as directed 1 Application in the mouth or throat 2 (two) times daily. 01/27/24  Yes Beverley Leita LABOR, PA-C  docusate sodium  (COLACE) 100 MG capsule Take 1 capsule (100 mg total) by mouth every 12 (twelve) hours. 06/07/23   Suzette Pac, MD    Allergies: Patient has no known allergies.    Review of Systems Negative except as per HPI Updated Vital Signs BP (!) 150/102 (BP Location: Right Arm)   Pulse 97   Temp 98.3 F (36.8 C)   Resp 16   Ht 5' 7 (1.702 m)   Wt 72.6 kg   SpO2 100%   BMI 25.07 kg/m   Physical Exam Vitals and nursing note reviewed.  Constitutional:      General: He is not in acute distress.    Appearance: He is well-developed. He is not diaphoretic.  HENT:     Head: Normocephalic and atraumatic.     Comments: Shallow lesions to buccal mucosa left and right sides Pulmonary:     Effort: Pulmonary effort is normal.  Musculoskeletal:        General: No swelling or tenderness.     Right lower leg: No edema.     Left lower leg: No edema.  Skin:    General: Skin is warm and dry.     Findings: No rash.  Neurological:     Mental Status: He is alert and oriented to person, place, and time.  Psychiatric:        Behavior: Behavior normal.     (all labs ordered are listed, but only abnormal results are displayed) Labs Reviewed - No data to  display  EKG: None  Radiology: No results found.   Procedures   Medications Ordered in the ED - No data to display                                  Medical Decision Making Risk Prescription drug management.   43 year old male with primary complaint of ulcers in his mouth.  Possible aphthous ulcers, will provide triamcinolone  oral base and recommend follow-up with his dentist for recheck.  Advised patient this could be cancer versus GI related.  Encouraged to recheck with PCP.  Regarding the redness in his legs, this has resolved as at this time.  DP pulses present bilaterally.  Patient is encouraged to take photos of the legs when this occurs for follow-up with his primary care.     Final diagnoses:  Aphthous ulcer of mouth    ED Discharge Orders          Ordered    triamcinolone  (KENALOG ) 0.1 % paste  2 times daily        01/27/24 0527  Beverley Leita LABOR, PA-C 01/27/24 0543    Haze Lonni PARAS, MD 01/27/24 [phone]

## 2024-01-27 NOTE — ED Triage Notes (Signed)
 The pt is here with mouth sores for one week and he also has a rash on his legs that have been there longer

## 2024-05-13 ENCOUNTER — Other Ambulatory Visit: Payer: Self-pay

## 2024-05-13 ENCOUNTER — Emergency Department (HOSPITAL_COMMUNITY)
Admission: EM | Admit: 2024-05-13 | Discharge: 2024-05-14 | Disposition: A | Payer: Self-pay | Attending: Emergency Medicine | Admitting: Emergency Medicine

## 2024-05-13 ENCOUNTER — Encounter (HOSPITAL_COMMUNITY): Payer: Self-pay | Admitting: *Deleted

## 2024-05-13 DIAGNOSIS — L03116 Cellulitis of left lower limb: Secondary | ICD-10-CM | POA: Insufficient documentation

## 2024-05-13 LAB — COMPREHENSIVE METABOLIC PANEL WITH GFR
ALT: 16 U/L (ref 0–44)
AST: 20 U/L (ref 15–41)
Albumin: 3.2 g/dL — ABNORMAL LOW (ref 3.5–5.0)
Alkaline Phosphatase: 67 U/L (ref 38–126)
Anion gap: 9 (ref 5–15)
BUN: 6 mg/dL (ref 6–20)
CO2: 25 mmol/L (ref 22–32)
Calcium: 8.8 mg/dL — ABNORMAL LOW (ref 8.9–10.3)
Chloride: 99 mmol/L (ref 98–111)
Creatinine, Ser: 0.66 mg/dL (ref 0.61–1.24)
GFR, Estimated: >60 mL/min (ref >60)
Glucose, Bld: 91 mg/dL (ref 70–99)
Potassium: 3.8 mmol/L (ref 3.5–5.1)
Sodium: 133 mmol/L — ABNORMAL LOW (ref 135–145)
Total Bilirubin: 0.5 mg/dL (ref 0.0–1.2)
Total Protein: 6.5 g/dL (ref 6.5–8.1)

## 2024-05-13 LAB — CBC WITH DIFFERENTIAL/PLATELET
Abs Immature Granulocytes: 0.01 K/uL (ref 0.00–0.07)
Basophils Absolute: 0 K/uL (ref 0.0–0.1)
Basophils Relative: 0 %
Eosinophils Absolute: 0.1 K/uL (ref 0.0–0.5)
Eosinophils Relative: 2 %
HCT: 40.3 % (ref 39.0–52.0)
Hemoglobin: 13.9 g/dL (ref 13.0–17.0)
Immature Granulocytes: 0 %
Lymphocytes Relative: 23 %
Lymphs Abs: 1.4 K/uL (ref 0.7–4.0)
MCH: 29.4 pg (ref 26.0–34.0)
MCHC: 34.5 g/dL (ref 30.0–36.0)
MCV: 85.4 fL (ref 80.0–100.0)
Monocytes Absolute: 1.1 K/uL — ABNORMAL HIGH (ref 0.1–1.0)
Monocytes Relative: 17 %
Neutro Abs: 3.6 K/uL (ref 1.7–7.7)
Neutrophils Relative %: 58 %
Platelets: 283 K/uL (ref 150–400)
RBC: 4.72 MIL/uL (ref 4.22–5.81)
RDW: 12.2 % (ref 11.5–15.5)
WBC: 6.2 K/uL (ref 4.0–10.5)
nRBC: 0 % (ref 0.0–0.2)

## 2024-05-13 NOTE — ED Provider Triage Note (Signed)
 Emergency Medicine Provider Triage Evaluation Note  Johnathan Poole , a 43 y.o. male  was evaluated in triage.  Pt complains of lower leg pain.  Review of Systems  Positive: Erythema, swelling Negative: SOB, CP, fever  Physical Exam  BP (!) 155/101 (BP Location: Right Arm)   Pulse (!) 106   Temp 98.2 F (36.8 C)   Resp 18   Ht 5' 7 (1.702 m)   Wt 72.6 kg   SpO2 100%   BMI 25.07 kg/m  Gen:   Awake, no distress   Resp:  Normal effort  MSK:   Moves extremities without difficulty  Other:    Medical Decision Making  Medically screening exam initiated at 8:31 PM.  Appropriate orders placed.  Johnathan Poole was informed that the remainder of the evaluation will be completed by another provider, this initial triage assessment does not replace that evaluation, and the importance of remaining in the ED until their evaluation is complete.  Patient with progressively worsening swelling to left lower leg. He feels it started with an ingrown hair just below the knee. No fever. No history of clots.   Johnathan Balls, PA-C 05/13/24 2033

## 2024-05-13 NOTE — ED Triage Notes (Signed)
 PT arrives via POV. Pt reports redness and swelling to left leg since Friday night. Pt states he did have an ingrown hair on his leg prior to the redness and swelling. PT is AxOx4.

## 2024-05-14 ENCOUNTER — Emergency Department (HOSPITAL_COMMUNITY): Payer: Self-pay

## 2024-05-14 DIAGNOSIS — M7989 Other specified soft tissue disorders: Secondary | ICD-10-CM

## 2024-05-14 LAB — D-DIMER, QUANTITATIVE: D-Dimer, Quant: <0.27 ug{FEU}/mL (ref 0.00–0.50)

## 2024-05-14 MED ORDER — CLINDAMYCIN PHOSPHATE 600 MG/50ML IV SOLN
600.0000 mg | Freq: Once | INTRAVENOUS | Status: AC
  Administered 2024-05-14: 600 mg via INTRAVENOUS
  Filled 2024-05-14: qty 50

## 2024-05-14 MED ORDER — CLINDAMYCIN HCL 150 MG PO CAPS: 150.0000 mg | ORAL_CAPSULE | Freq: Three times a day (TID) | ORAL | 0 refills | Status: AC

## 2024-05-14 NOTE — ED Notes (Signed)
 Called pt for vitals check and no response.SABRASABRA
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
## 2024-05-14 NOTE — Discharge Instructions (Addendum)
 You were seen in the ED for swelling of your left leg.  We did an ultrasound that did not show any clots.  Our that we checked labs were all normal.  We suspect this is an infection of your skin called cellulitis.  Please avoid cutting or inserting any needles into your leg.  Please keep the cut on your leg clean and dry.  We are prescribing clindamycin which you should take 3 times daily for the next 7 days.  You received a dose of this antibiotic here in the ED.  Please return to be seen if your swelling, pain, and redness are worsening after a day or 2.  Please also return if you notice new high fevers.  Please see your PCP to follow-up early next week.

## 2024-05-14 NOTE — ED Notes (Addendum)
 Pt. Did not respond when called.

## 2024-05-14 NOTE — ED Notes (Signed)
 Pt called and is in waiting room. Present

## 2024-05-14 NOTE — Progress Notes (Signed)
 Left lower ext venous  has been completed. Refer to [facility] under chart review to view preliminary results.   05/14/2024  10:53 AM Cay Kath, Ricka BIRCH

## 2024-05-14 NOTE — ED Notes (Signed)
 Patient transported to Ultrasound

## 2024-06-08 ENCOUNTER — Other Ambulatory Visit: Payer: Self-pay

## 2024-06-08 DIAGNOSIS — S0501XA Injury of conjunctiva and corneal abrasion without foreign body, right eye, initial encounter: Secondary | ICD-10-CM | POA: Insufficient documentation

## 2024-06-08 DIAGNOSIS — F17210 Nicotine dependence, cigarettes, uncomplicated: Secondary | ICD-10-CM | POA: Insufficient documentation

## 2024-06-08 DIAGNOSIS — W228XXA Striking against or struck by other objects, initial encounter: Secondary | ICD-10-CM | POA: Insufficient documentation

## 2024-06-08 MED ORDER — TETRACAINE HCL 0.5 % OP SOLN
2.0000 [drp] | Freq: Once | OPHTHALMIC | Status: AC
  Administered 2024-06-09: 2 [drp] via OPHTHALMIC
  Filled 2024-06-08: qty 4

## 2024-06-08 MED ORDER — FLUORESCEIN SODIUM 1 MG OP STRP
1.0000 | ORAL_STRIP | Freq: Once | OPHTHALMIC | Status: AC
  Administered 2024-06-09: 1 via OPHTHALMIC
  Filled 2024-06-08: qty 1

## 2024-06-08 NOTE — ED Triage Notes (Signed)
 Pt POV reporting persistent eye pain/redness, was doing yard work yesterday and branch stuck him in eye.

## 2024-06-09 ENCOUNTER — Emergency Department (HOSPITAL_BASED_OUTPATIENT_CLINIC_OR_DEPARTMENT_OTHER)
Admission: EM | Admit: 2024-06-09 | Discharge: 2024-06-09 | Disposition: A | Discharge status: Home / Self Care | Payer: Self-pay | Attending: Emergency Medicine | Admitting: Emergency Medicine

## 2024-06-09 DIAGNOSIS — S0501XA Injury of conjunctiva and corneal abrasion without foreign body, right eye, initial encounter: Secondary | ICD-10-CM

## 2024-06-09 MED ORDER — TOBRAMYCIN 0.3 % OP OINT: 1.0000 | TOPICAL_OINTMENT | Freq: Three times a day (TID) | OPHTHALMIC | 0 refills | Status: AC

## 2024-06-09 NOTE — Discharge Instructions (Signed)
 You were evaluated in the Emergency Department and after careful evaluation, we did not find any emergent condition requiring admission or further testing in the hospital.  Your exam/testing today is overall reassuring.  Symptoms seem to be due to a corneal abrasion.  Use the eye ointment as directed and follow-up with your eye doctor within the next week for a reevaluation.  Please return to the Emergency Department if you experience any worsening of your condition.   Thank you for allowing us  to be a part of your care.

## 2024-06-09 NOTE — ED Notes (Addendum)
 Woods Lamp at bedside for EDP
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
